# Patient Record
Sex: Female | Born: 1990 | Hispanic: Yes | Marital: Married | Smoking: Never smoker
Health system: Southern US, Community
[De-identification: ages and names within clinical notes are randomized; demographics above are authoritative.]

## PROBLEM LIST (undated history)

## (undated) DIAGNOSIS — K649 Unspecified hemorrhoids: Secondary | ICD-10-CM

## (undated) DIAGNOSIS — L309 Dermatitis, unspecified: Secondary | ICD-10-CM

## (undated) DIAGNOSIS — M722 Plantar fascial fibromatosis: Secondary | ICD-10-CM

## (undated) DIAGNOSIS — K859 Acute pancreatitis without necrosis or infection, unspecified: Secondary | ICD-10-CM

## (undated) HISTORY — DX: Plantar fascial fibromatosis: M72.2

## (undated) HISTORY — PX: CHOLECYSTECTOMY: SHX55

## (undated) HISTORY — DX: Dermatitis, unspecified: L30.9

## (undated) HISTORY — DX: Unspecified hemorrhoids: K64.9

---

## 2016-09-14 ENCOUNTER — Encounter (HOSPITAL_COMMUNITY): Payer: Self-pay | Admitting: Emergency Medicine

## 2016-09-14 ENCOUNTER — Inpatient Hospital Stay (HOSPITAL_COMMUNITY)
Admission: AD | Admit: 2016-09-14 | Discharge: 2016-09-14 | Disposition: A | Discharge status: Home / Self Care | Payer: Medicaid Other | Source: Ambulatory Visit | Attending: Family Medicine | Admitting: Family Medicine

## 2016-09-14 ENCOUNTER — Encounter (HOSPITAL_COMMUNITY): Payer: Self-pay

## 2016-09-14 ENCOUNTER — Ambulatory Visit (HOSPITAL_COMMUNITY)
Admission: EM | Admit: 2016-09-14 | Discharge: 2016-09-14 | Disposition: A | Discharge status: Home / Self Care | Payer: Medicaid Other

## 2016-09-14 DIAGNOSIS — R109 Unspecified abdominal pain: Secondary | ICD-10-CM | POA: Insufficient documentation

## 2016-09-14 DIAGNOSIS — R1030 Lower abdominal pain, unspecified: Secondary | ICD-10-CM

## 2016-09-14 DIAGNOSIS — N926 Irregular menstruation, unspecified: Secondary | ICD-10-CM | POA: Diagnosis not present

## 2016-09-14 HISTORY — DX: Acute pancreatitis without necrosis or infection, unspecified: K85.90

## 2016-09-14 LAB — CBC
HCT: 30.1 % — ABNORMAL LOW (ref 36.0–46.0)
Hemoglobin: 9.6 g/dL — ABNORMAL LOW (ref 12.0–15.0)
MCH: 28 pg (ref 26.0–34.0)
MCHC: 31.9 g/dL (ref 30.0–36.0)
MCV: 87.8 fL (ref 78.0–100.0)
PLATELETS: 358 10*3/uL (ref 150–400)
RBC: 3.43 MIL/uL — ABNORMAL LOW (ref 3.87–5.11)
RDW: 16.6 % — AB (ref 11.5–15.5)
WBC: 4 10*3/uL (ref 4.0–10.5)

## 2016-09-14 LAB — WET PREP, GENITAL
CLUE CELLS WET PREP: NONE SEEN
SPERM: NONE SEEN
Trich, Wet Prep: NONE SEEN
WBC WET PREP: NONE SEEN
YEAST WET PREP: NONE SEEN

## 2016-09-14 LAB — URINALYSIS, ROUTINE W REFLEX MICROSCOPIC
BACTERIA UA: NONE SEEN
Bilirubin Urine: NEGATIVE
Glucose, UA: NEGATIVE mg/dL
Ketones, ur: NEGATIVE mg/dL
Leukocytes, UA: NEGATIVE
Nitrite: NEGATIVE
PH: 6 (ref 5.0–8.0)
Protein, ur: NEGATIVE mg/dL
Specific Gravity, Urine: 1.005 (ref 1.005–1.030)
WBC UA: NONE SEEN WBC/hpf (ref 0–5)

## 2016-09-14 LAB — POCT PREGNANCY, URINE: PREG TEST UR: NEGATIVE

## 2016-09-14 NOTE — MAU Note (Signed)
Having pelvic, spotting.  proonged menstrual cycle.  Heavy cycles, unable to get up or function.  Feels like ovaries are heavy when she walks. LMP was Jan 7, bleeding has continued, spotting now.  Also has noted bleeding with intercourse, is painful

## 2016-09-14 NOTE — Discharge Instructions (Signed)
Abnormal Uterine Bleeding Abnormal uterine bleeding means bleeding from the vagina that is not your normal menstrual period. This can be:  Bleeding or spotting between periods.  Bleeding after sex (sexual intercourse).  Bleeding that is heavier or more than normal.  Periods that last longer than usual.  Bleeding after menopause. There are many problems that may cause this. Treatment will depend on the cause of the bleeding. Any kind of bleeding that is not normal should be reviewed by your doctor. Follow these instructions at home: Watch your condition for any changes. These actions may lessen any discomfort you are having:  Do not use tampons or douches as told by your doctor.  Change your pads often. You should get regular pelvic exams and Pap tests. Keep all appointments for tests as told by your doctor. Contact a doctor if:  You are bleeding for more than 1 week.  You feel dizzy at times. Get help right away if:  You pass out.  You have to change pads every 15 to 30 minutes.  You have belly pain.  You have a fever.  You become sweaty or weak.  You are passing large blood clots from the vagina.  You feel sick to your stomach (nauseous) and throw up (vomit). This information is not intended to replace advice given to you by your health care provider. Make sure you discuss any questions you have with your health care provider. Document Released: 06/06/2009 Document Revised: 01/15/2016 Document Reviewed: 03/08/2013 Elsevier Interactive Patient Education  2017 Elsevier Inc.  

## 2016-09-14 NOTE — ED Triage Notes (Signed)
Here for intermittent abd pain onset 6 months associated w/heavy menstrual bleeding, bleeding w/SI,   Reports she goes through 5 maternity pads per day when her menstrual cycle comes on and will last for x10 days.   Denies: fevers, n/v/d, urinary sx.   Pt does not have a PCP  A&O x4... NAD

## 2016-09-14 NOTE — ED Notes (Signed)
Pt left... Said she will go to [facility].... Did not want for us to waste here time if we were not going to do anything... Adv pt that she can see provider and wait for his clinical decision but she declined.

## 2016-09-14 NOTE — MAU Provider Note (Signed)
History    Patient Abigail Terry is a 26 year old G2P2 here with complaints of abdominal pain and irregular period. She is taking hormonal contraceptives. She has  irregular periods and has been having this problem for 6 months. Her suprapubic abdominal pain started yesterday and it is off and on. She states she has a lot of pain when she has her period. Her last period was in early January; her period has slowed down to spotting but she is still having pain.  [facility] today but left because she felt like "they weren't doing anything for her."  She sometimes has pain and bleeding with intercourse.  CSN: [id number]  Arrival date and time: 09/14/16 1112   None     Chief Complaint  Patient presents with  . Abdominal Pain   Abdominal Pain  This is a new problem. The current episode started yesterday. The onset quality is gradual. The problem occurs 2 to 4 times per day. The problem has been unchanged. The pain is located in the suprapubic region. The pain is at a severity of 7/10. The pain is moderate. The quality of the pain is cramping. The abdominal pain does not radiate. Pertinent negatives include no anorexia, arthralgias, belching, constipation, diarrhea, dysuria, fever, flatus, frequency, headaches, hematochezia, hematuria, melena, myalgias, nausea, vomiting or weight loss. Nothing aggravates the pain. The pain is relieved by nothing. She has tried nothing for the symptoms.    OB History    Gravida Para Term Preterm AB Living   2         2   SAB TAB Ectopic Multiple Live Births                  Past Medical History:  Diagnosis Date  . Medical history non-contributory   . Pancreatitis     Past Surgical History:  Procedure Laterality Date  . CHOLECYSTECTOMY      No family history on file.  Social History  Substance Use Topics  . Smoking status: Never Smoker  . Smokeless tobacco: Never Used  . Alcohol use No    Allergies: No Known  Allergies  No prescriptions prior to admission.    Review of Systems  Constitutional: Negative for fever and weight loss.  HENT: Negative.   Eyes: Negative.   Respiratory: Negative.   Gastrointestinal: Positive for abdominal pain. Negative for anorexia, constipation, diarrhea, flatus, hematochezia, melena, nausea and vomiting.  Endocrine: Negative.   Genitourinary: Positive for pelvic pain and vaginal bleeding. Negative for decreased urine volume, dysuria, frequency, hematuria, urgency, vaginal discharge and vaginal pain.  Musculoskeletal: Negative for arthralgias and myalgias.  Allergic/Immunologic: Negative.   Neurological: Negative for headaches.  Hematological: Negative.   Psychiatric/Behavioral: Negative.    Physical Exam   Blood pressure 106/67, pulse 72, temperature 98 F (36.7 C), temperature source Oral, resp. rate 16, weight 166 lb 12 oz (75.6 kg), last menstrual period 08/29/2016.  Physical Exam  Constitutional: She is oriented to person, place, and time. She appears well-developed.  HENT:  Head: Normocephalic.  Eyes: Pupils are equal, round, and reactive to light.  Neck: Normal range of motion.  Respiratory: Effort normal. No respiratory distress. She has no wheezes. She has no rales. She exhibits no tenderness.  GI: Bowel sounds are normal. She exhibits no distension and no mass. There is tenderness. There is no rebound and no guarding.  Genitourinary: Rectal exam shows guaiac negative stool. Vaginal discharge found.  Genitourinary Comments: NEFG.  Scant milky discharge in the vaginal vault; no lesions on vaginal walls. Cervix is pink with no lesions; some extroprion presents but non-friable. No CMT. Mild suprapubic tenderness with bimanual. No CVA.  No bleeding observed from the cervix.   Musculoskeletal: Normal range of motion.  Neurological: She is alert and oriented to person, place, and time.  Skin: Skin is warm and dry.  Psychiatric: She has a normal mood and  affect.  No vaginal bleeding present at this exam.   MAU Course  Procedures  MDM  -bimanual and spec exam -wet prep -GC CT pending Assessment and Plan   1. Lower abdominal pain    2. Discharge home with recommendations to take tylenol /ibuprofen for symptoms, heat, and rest.  3. Sent a message to the [facility] to schedule her for a gyn visit to discuss contraceptive methods, pain management and further work-up for dysmenorrhea and menorrhagia.    Charlesetta Garibaldi Kooistra CNM 09/14/2016, 1:18 PM

## 2016-09-15 LAB — GC/CHLAMYDIA PROBE AMP (~~LOC~~) NOT AT ARMC
CHLAMYDIA, DNA PROBE: NEGATIVE
NEISSERIA GONORRHEA: NEGATIVE

## 2017-06-02 ENCOUNTER — Inpatient Hospital Stay (HOSPITAL_COMMUNITY)
Admission: AD | Admit: 2017-06-02 | Discharge: 2017-06-02 | Disposition: A | Discharge status: Home / Self Care | Payer: Medicaid Other | Source: Ambulatory Visit | Attending: Family Medicine | Admitting: Family Medicine

## 2017-06-02 ENCOUNTER — Encounter (HOSPITAL_COMMUNITY): Payer: Self-pay | Admitting: *Deleted

## 2017-06-02 DIAGNOSIS — Z202 Contact with and (suspected) exposure to infections with a predominantly sexual mode of transmission: Secondary | ICD-10-CM | POA: Insufficient documentation

## 2017-06-02 DIAGNOSIS — Z9049 Acquired absence of other specified parts of digestive tract: Secondary | ICD-10-CM | POA: Diagnosis not present

## 2017-06-02 DIAGNOSIS — Z113 Encounter for screening for infections with a predominantly sexual mode of transmission: Secondary | ICD-10-CM

## 2017-06-02 DIAGNOSIS — R109 Unspecified abdominal pain: Secondary | ICD-10-CM

## 2017-06-02 DIAGNOSIS — Z3202 Encounter for pregnancy test, result negative: Secondary | ICD-10-CM | POA: Diagnosis not present

## 2017-06-02 LAB — URINALYSIS, ROUTINE W REFLEX MICROSCOPIC
Bilirubin Urine: NEGATIVE
Glucose, UA: NEGATIVE mg/dL
KETONES UR: NEGATIVE mg/dL
Leukocytes, UA: NEGATIVE
Nitrite: NEGATIVE
PROTEIN: NEGATIVE mg/dL
Specific Gravity, Urine: 1.02 (ref 1.005–1.030)
pH: 6 (ref 5.0–8.0)

## 2017-06-02 LAB — WET PREP, GENITAL
Clue Cells Wet Prep HPF POC: NONE SEEN
Sperm: NONE SEEN
Trich, Wet Prep: NONE SEEN
YEAST WET PREP: NONE SEEN

## 2017-06-02 LAB — POCT PREGNANCY, URINE: Preg Test, Ur: NEGATIVE

## 2017-06-02 MED ORDER — KETOROLAC TROMETHAMINE 60 MG/2ML IM SOLN
60.0000 mg | Freq: Once | INTRAMUSCULAR | Status: AC
  Administered 2017-06-02: 60 mg via INTRAMUSCULAR
  Filled 2017-06-02: qty 2

## 2017-06-02 NOTE — Discharge Instructions (Signed)
Dolor abdominal en adultos °Abdominal Pain, Adult °El dolor abdominal puede tener muchas causas. A menudo, no es grave y mejora sin tratamiento o con tratamiento en la casa. Sin embargo, a veces el dolor abdominal es intenso. El médico revisará sus antecedentes médicos y le hará un examen físico para tratar de determinar la causa del dolor abdominal. °Siga estas instrucciones en su casa: °· Tome los medicamentos de venta libre y los recetados solamente como se lo haya indicado el médico. No tome un laxante a menos que se lo haya indicado el médico. °· Beba suficiente líquido para mantener la orina clara o de color amarillo pálido. °· Controle su afección para ver si hay cambios. °· Concurra a todas las visitas de control como se lo haya indicado el médico. Esto es importante. °Comuníquese con un médico si: °· El dolor abdominal cambia o empeora. °· No tiene apetito o baja de peso sin proponérselo. °· Está estreñido o tiene diarrea durante más de 2 o 3 días. °· Tiene dolor cuando orina o defeca. °· El dolor abdominal lo despierta de noche. °· El dolor empeora con las comidas, después de comer o con determinados alimentos. °· Tiene vómitos y no puede retener nada. °· Tiene fiebre. °Solicite ayuda de inmediato si: °· El dolor no desaparece tan pronto como el médico le dijo que era esperable. °· No puede detener los vómitos. °· El dolor se siente solo en zonas del abdomen, como el lado derecho o la parte inferior izquierda del abdomen. °· Las heces son sanguinolentas o de color negro, o de aspecto alquitranado. °· Tiene dolor intenso, cólicos, o meteorismo en el abdomen. °· Tiene signos de deshidratación, por ejemplo: °? Orina oscura, muy escasa o falta de orina. °? Labios agrietados. °? Boca seca. °? Ojos hundidos. °? Somnolencia. °? Debilidad. °Esta información no tiene como fin reemplazar el consejo del médico. Asegúrese de hacerle al médico cualquier pregunta que tenga. °Document Released: 08/09/2005 Document  Revised: 07/29/2016 Document Reviewed: 01/21/2016 °Elsevier Interactive Patient Education © 2017 Elsevier Inc. ° °

## 2017-06-02 NOTE — MAU Provider Note (Signed)
History     CSN: [id number]  Arrival date and time: 06/02/17 1030  First Provider Initiated Contact with Patient 06/02/17 1315      Chief Complaint  Patient presents with  . Pelvic Pain  . Vaginal Discharge   HPI Abigail Terry is a 26 y.o. female who presents with abdominal pain & vaginal discharge. Reports lower abdominal cramping since yesterday. Rates pain 7/10. Took 1 dose of ibuprofen yesterday without relief. Denies n/v/d, constipation, dysuria, dyspareunia, fever/chills, or postcoital bleeding. Noticed clear watery vaginal discharge since this morning. No odor to discharge & no vaginal irritation. LMP was 9/4; light bleeding started on Tuesday; history of irregular menses & dysmenorrhea. Has not been on OCPs x 4 months. Sexually active with 1 partner x 5 years. Denies history of STIs. Has a PCP; last pap was 2 years ago & was normal.   Spanish interpreter at bedside  Past Medical History:  Diagnosis Date  . Pancreatitis     Past Surgical History:  Procedure Laterality Date  . CHOLECYSTECTOMY      History reviewed. No pertinent family history.  Social History  Substance Use Topics  . Smoking status: Never Smoker  . Smokeless tobacco: Never Used  . Alcohol use No    Allergies: No Known Allergies  Prescriptions Prior to Admission  Medication Sig Dispense Refill Last Dose  . norethindrone-ethinyl estradiol-iron (ESTROSTEP FE,TILIA FE,TRI-LEGEST FE) 1-20/1-30/1-35 MG-MCG tablet Take 1 tablet by mouth daily.   08/23/2016    Review of Systems  Constitutional: Negative.   Gastrointestinal: Positive for abdominal pain. Negative for constipation, diarrhea, nausea and vomiting.  Genitourinary: Positive for vaginal bleeding and vaginal discharge. Negative for dyspareunia and dysuria.  Musculoskeletal: Negative for back pain.   Physical Exam   Blood pressure 110/64, pulse 86, temperature 98.6 F (37 C), resp. rate 18, height 5' 3.5" (1.613 m), weight 180 lb (81.6  kg), last menstrual period 04/26/2017.  Physical Exam  Nursing note and vitals reviewed. Constitutional: She is oriented to person, place, and time. She appears well-developed and well-nourished. No distress.  HENT:  Head: Normocephalic and atraumatic.  Eyes: Conjunctivae are normal. Right eye exhibits no discharge. Left eye exhibits no discharge. No scleral icterus.  Neck: Normal range of motion.  Cardiovascular: Normal rate, regular rhythm and normal heart sounds.   No murmur heard. Respiratory: Effort normal and breath sounds normal. No respiratory distress. She has no wheezes.  GI: Soft. Bowel sounds are normal. She exhibits no distension. There is tenderness in the suprapubic area and left lower quadrant. There is no rigidity, no rebound, no guarding and no CVA tenderness.  Genitourinary: Uterus normal. Cervix exhibits discharge (small amount of thin clear discharge) and friability. Cervix exhibits no motion tenderness. Right adnexum displays no mass and no tenderness. Left adnexum displays no mass and no tenderness. No bleeding in the vagina. Vaginal discharge found.  Neurological: She is alert and oriented to person, place, and time.  Skin: Skin is warm and dry. She is not diaphoretic.  Psychiatric: She has a normal mood and affect. Her behavior is normal. Judgment and thought content normal.    MAU Course  Procedures Results for orders placed or performed during the hospital encounter of 06/02/17 (from the past 24 hour(s))  Urinalysis, Routine w reflex microscopic     Status: Abnormal   Collection Time: 06/02/17 10:40 AM  Result Value Ref Range   Color, Urine YELLOW YELLOW   APPearance CLEAR CLEAR   Specific Gravity, Urine 1.020 1.005 -  1.030   pH 6.0 5.0 - 8.0   Glucose, UA NEGATIVE NEGATIVE mg/dL   Hgb urine dipstick SMALL (A) NEGATIVE   Bilirubin Urine NEGATIVE NEGATIVE   Ketones, ur NEGATIVE NEGATIVE mg/dL   Protein, ur NEGATIVE NEGATIVE mg/dL   Nitrite NEGATIVE NEGATIVE    Leukocytes, UA NEGATIVE NEGATIVE   RBC / HPF 0-5 0 - 5 RBC/hpf   WBC, UA 0-5 0 - 5 WBC/hpf   Bacteria, UA RARE (A) NONE SEEN   Squamous Epithelial / LPF 0-5 (A) NONE SEEN   Mucus PRESENT   Pregnancy, urine POC     Status: None   Collection Time: 06/02/17 11:41 AM  Result Value Ref Range   Preg Test, Ur NEGATIVE NEGATIVE  Wet prep, genital     Status: Abnormal   Collection Time: 06/02/17  1:30 PM  Result Value Ref Range   Yeast Wet Prep HPF POC NONE SEEN NONE SEEN   Trich, Wet Prep NONE SEEN NONE SEEN   Clue Cells Wet Prep HPF POC NONE SEEN NONE SEEN   WBC, Wet Prep HPF POC MODERATE (A) NONE SEEN   Sperm NONE SEEN     MDM UPT negative GC/CT & wet prep collected Declines labs. VSS, NAD.  Toradol IM given -- patient reports improvement in symptoms & requesting to be discharged home  Assessment and Plan  A: 1. Abdominal pain in female   2. Pregnancy examination or test, negative result   3. Screen for STD (sexually transmitted disease)    P: Discharge home Take ibuprofen on schedule for next 3 days.  F/u with PCP if pain continues F/u with ED if symptoms worsen or fever develops  Abigail Terry 06/02/2017, 1:15 PM

## 2017-06-02 NOTE — MAU Note (Signed)
Pt c/o pelvic pain and pain in her left flank x 15 days.Started having vag bleeding yesterday and clear fluid leaking today. Took home pregnancy test on Monday and it was negative.

## 2017-06-03 LAB — GC/CHLAMYDIA PROBE AMP (~~LOC~~) NOT AT ARMC
CHLAMYDIA, DNA PROBE: NEGATIVE
Neisseria Gonorrhea: NEGATIVE

## 2021-06-10 ENCOUNTER — Emergency Department (HOSPITAL_COMMUNITY)
Admission: EM | Admit: 2021-06-10 | Discharge: 2021-06-10 | Disposition: A | Discharge status: Home / Self Care | Payer: Medicaid Other | Attending: Emergency Medicine | Admitting: Emergency Medicine

## 2021-06-10 ENCOUNTER — Emergency Department (HOSPITAL_COMMUNITY): Payer: Medicaid Other

## 2021-06-10 ENCOUNTER — Other Ambulatory Visit: Payer: Self-pay

## 2021-06-10 ENCOUNTER — Encounter (HOSPITAL_COMMUNITY): Payer: Self-pay | Admitting: Oncology

## 2021-06-10 DIAGNOSIS — N920 Excessive and frequent menstruation with regular cycle: Secondary | ICD-10-CM

## 2021-06-10 DIAGNOSIS — O200 Threatened abortion: Secondary | ICD-10-CM | POA: Diagnosis not present

## 2021-06-10 DIAGNOSIS — Z3A01 Less than 8 weeks gestation of pregnancy: Secondary | ICD-10-CM | POA: Diagnosis not present

## 2021-06-10 LAB — URINALYSIS, ROUTINE W REFLEX MICROSCOPIC
Bilirubin Urine: NEGATIVE
Glucose, UA: NEGATIVE mg/dL
Ketones, ur: NEGATIVE mg/dL
Leukocytes,Ua: NEGATIVE
Nitrite: NEGATIVE
Protein, ur: NEGATIVE mg/dL
Specific Gravity, Urine: 1.013 (ref 1.005–1.030)
pH: 5 (ref 5.0–8.0)

## 2021-06-10 LAB — CBC WITH DIFFERENTIAL/PLATELET
Abs Immature Granulocytes: 0 10*3/uL (ref 0.00–0.07)
Basophils Absolute: 0 10*3/uL (ref 0.0–0.1)
Basophils Relative: 1 %
Eosinophils Absolute: 0.2 10*3/uL (ref 0.0–0.5)
Eosinophils Relative: 4 %
HCT: 40.6 % (ref 36.0–46.0)
Hemoglobin: 12.9 g/dL (ref 12.0–15.0)
Immature Granulocytes: 0 %
Lymphocytes Relative: 31 %
Lymphs Abs: 1.7 10*3/uL (ref 0.7–4.0)
MCH: 30.2 pg (ref 26.0–34.0)
MCHC: 31.8 g/dL (ref 30.0–36.0)
MCV: 95.1 fL (ref 80.0–100.0)
Monocytes Absolute: 0.5 10*3/uL (ref 0.1–1.0)
Monocytes Relative: 9 %
Neutro Abs: 3 10*3/uL (ref 1.7–7.7)
Neutrophils Relative %: 55 %
Platelets: 295 10*3/uL (ref 150–400)
RBC: 4.27 MIL/uL (ref 3.87–5.11)
RDW: 13 % (ref 11.5–15.5)
WBC: 5.4 10*3/uL (ref 4.0–10.5)
nRBC: 0 % (ref 0.0–0.2)

## 2021-06-10 LAB — BASIC METABOLIC PANEL
Anion gap: 6 (ref 5–15)
BUN: 13 mg/dL (ref 6–20)
CO2: 25 mmol/L (ref 22–32)
Calcium: 9.6 mg/dL (ref 8.9–10.3)
Chloride: 104 mmol/L (ref 98–111)
Creatinine, Ser: 0.64 mg/dL (ref 0.44–1.00)
GFR, Estimated: >60 mL/min (ref >60)
Glucose, Bld: 92 mg/dL (ref 70–99)
Potassium: 4 mmol/L (ref 3.5–5.1)
Sodium: 135 mmol/L (ref 135–145)

## 2021-06-10 LAB — I-STAT BETA HCG BLOOD, ED (MC, WL, AP ONLY): I-stat hCG, quantitative: >2000 m[IU]/mL — ABNORMAL HIGH (ref <5)

## 2021-06-10 LAB — HCG, QUANTITATIVE, PREGNANCY: hCG, Beta Chain, Quant, S: 17267 m[IU]/mL — ABNORMAL HIGH (ref <5)

## 2021-06-10 NOTE — Discharge Instructions (Addendum)
Follow up with your [facility] doctor tomorrow as planned.  They will be able to see the results from today.  Unfortunately, the [place] does not show a fetus and your pregnancy hormone level is decreasing.  This is concerning for a failed pregnancy.

## 2021-06-10 NOTE — ED Notes (Signed)
Pt ambulatory to bathroom w/ no assist.  

## 2021-06-10 NOTE — ED Triage Notes (Signed)
Pt presents d/t left ovary pain and vaginal bleeding in pregnancy that began last night.  Pt reports being [redacted] weeks pregnant. Pt is seeing spots of blood when wiping.

## 2021-06-10 NOTE — ED Provider Notes (Signed)
[facility]EMERGENCY DEPT Provider Note   CSN: [id number] Arrival date & time: 06/10/21  5456     History Chief Complaint  Patient presents with   Abdominal Pain    Abigail Terry is a 30 y.o. female.   Abdominal Pain   Pt is G3P2 at approx 2 months EGA.  Pt started having pain in her lower abdomen and bleeeding.  The pain comes and goes.  Pt has been evaluated before and was told she had an IUP but no fetal heart tones.  Patient has followed up with her OB doctor.  She feels to follow-up tomorrow again to be rechecked.  Patient denies any problems with vomiting or diarrhea.  She has noticed small amount of vaginal bleeding.  OB MD is in [facility].  Past Medical History:  Diagnosis Date   Pancreatitis     There are no problems to display for this patient.   Past Surgical History:  Procedure Laterality Date   CHOLECYSTECTOMY       OB History     Gravida  4   Para  2   Term  2   Preterm      AB  1   Living  2      SAB  1   IAB      Ectopic      Multiple      Live Births              No family history on file.  Social History   Tobacco Use   Smoking status: Never   Smokeless tobacco: Never  Substance Use Topics   Alcohol use: No   Drug use: No    Home Medications Prior to Admission medications   Medication Sig Start Date End Date Taking? Authorizing Provider  ibuprofen (ADVIL,MOTRIN) 200 MG tablet Take 400 mg by mouth every 6 (six) hours as needed.    [provider]    Allergies    Patient has no known allergies.  Review of Systems   Review of Systems  Gastrointestinal:  Positive for abdominal pain.  All other systems reviewed and are negative.  Physical Exam Updated Vital Signs BP 124/75   Pulse 74   Temp 98.9 F (37.2 C) (Oral)   Resp 16   LMP 03/19/2021 (Exact Date)   SpO2 99%   Physical Exam Vitals and nursing note reviewed.  Constitutional:      General: She is not in  acute distress.    Appearance: She is well-developed.  HENT:     Head: Normocephalic and atraumatic.     Right Ear: External ear normal.     Left Ear: External ear normal.  Eyes:     General: No scleral icterus.       Right eye: No discharge.        Left eye: No discharge.     Conjunctiva/sclera: Conjunctivae normal.  Neck:     Trachea: No tracheal deviation.  Cardiovascular:     Rate and Rhythm: Normal rate and regular rhythm.  Pulmonary:     Effort: Pulmonary effort is normal. No respiratory distress.     Breath sounds: Normal breath sounds. No stridor. No wheezing or rales.  Abdominal:     General: Bowel sounds are normal. There is no distension.     Palpations: Abdomen is soft.     Tenderness: There is no abdominal tenderness. There is no guarding or rebound.  Genitourinary:    Vagina:  No tenderness.     Uterus: Not tender.      Adnexa:        Right: No mass.         Left: No mass.       Comments: Small amount of blood noted on cervical os, Musculoskeletal:        General: No tenderness or deformity.     Cervical back: Neck supple.  Skin:    General: Skin is warm and dry.     Findings: No rash.  Neurological:     General: No focal deficit present.     Mental Status: She is alert.     Cranial Nerves: No cranial nerve deficit (no facial droop, extraocular movements intact, no slurred speech).     Sensory: No sensory deficit.     Motor: No abnormal muscle tone or seizure activity.     Coordination: Coordination normal.  Psychiatric:        Mood and Affect: Mood normal.    ED Results / Procedures / Treatments   Labs (all labs ordered are listed, but only abnormal results are displayed) Labs Reviewed  HCG, QUANTITATIVE, PREGNANCY - Abnormal; Notable for the following components:      Result Value   hCG, Beta Chain, Quant, S 17,267 (*)    All other components within normal limits  URINALYSIS, ROUTINE W REFLEX MICROSCOPIC - Abnormal; Notable for the following  components:   Color, Urine STRAW (*)    Hgb urine dipstick SMALL (*)    Bacteria, UA RARE (*)    All other components within normal limits  I-STAT BETA HCG BLOOD, ED ([facility], [facility], [facility] ONLY) - Abnormal; Notable for the following components:   I-stat hCG, quantitative >2,000.0 (*)    All other components within normal limits  CBC WITH DIFFERENTIAL/PLATELET  BASIC METABOLIC PANEL  RPR  GC/CHLAMYDIA PROBE AMP (Centre) NOT AT [facility]    EKG None  Radiology No results found.  Procedures Procedures   Medications Ordered in ED Medications - No data to display  ED Course  I have reviewed the triage vital signs and the nursing notes.  Pertinent labs & imaging results that were available during my care of the patient were reviewed by me and considered in my medical decision making (see chart for details).  Clinical Course as of 06/10/21 1540  Wed Jun 10, 2021  1447 Blood type o positive.  Quant elevated.  CBC and metabolic panel normal.  US shows gestational sac but no IUP.   Concerning for miscarriage [JK]    Clinical Course User Index [JK] Linwood Dibbles, MD   MDM Rules/Calculators/A&P                           Patient's presentation is concerning with inevitable miscarriage.  She has decreasing quantitative hCG levels.  She has had 2 ultrasounds now that demonstrate a gestational sac but no fetal pole.  Patient is afebrile.  No signs of serious bleeding.  She has outpatient follow-up with OB/GYN tomorrow which is appropriate to determine further treatment.  Findings and plan were discussed using Spanish language translator Final Clinical Impression(s) / ED Diagnoses Final diagnoses:  Threatened miscarriage    Rx / DC Orders ED Discharge Orders     None        Linwood Dibbles, MD 06/10/21 1540

## 2021-06-10 NOTE — ED Provider Notes (Addendum)
Emergency Medicine Provider Triage Evaluation Note  Abigail Terry , a 30 y.o. female  was evaluated in triage.  Pt complains of vaginal bleeding and pain.  She is approximately [redacted] weeks pregnant according to her last OB note.  She was seen 2 weeks ago at [facility] for similar complaints.  Initially had ultrasound in September at outside hospital which showed gestational sac however no fetal heart tones.  Pain began yesterday.  O positive blood from outside records  Review of Systems  Positive: Abdominal pain, vaginal bleeding Negative:   Physical Exam  BP 114/67 (BP Location: Right Arm)   Pulse 90   Temp 98.5 F (36.9 C) (Oral)   Resp 18   LMP 03/19/2021 (Exact Date)   SpO2 99%  Gen:   Awake, no distress   Resp:  Normal effort  MSK:   Moves extremities without difficulty  Other:    Medical Decision Making  Medically screening exam initiated at 12:57 PM.  Appropriate orders placed.  Abigail Terry was informed that the remainder of the evaluation will be completed by another provider, this initial triage assessment does not replace that evaluation, and the importance of remaining in the ED until their evaluation is complete.  Work-up started  Hemodynamically stable      Ermine Stebbins A, PA-C 06/10/21 1259    Mancel Bale, MD 06/10/21 [phone]

## 2021-06-11 LAB — GC/CHLAMYDIA PROBE AMP (~~LOC~~) NOT AT ARMC
Chlamydia: NEGATIVE
Comment: NEGATIVE
Comment: NORMAL
Neisseria Gonorrhea: NEGATIVE

## 2022-05-13 ENCOUNTER — Other Ambulatory Visit: Payer: Self-pay | Admitting: Physician Assistant

## 2022-05-13 DIAGNOSIS — N644 Mastodynia: Secondary | ICD-10-CM

## 2022-11-14 IMAGING — US US OB COMP LESS 14 WK
2 series · 15 of 28 positions shown · non-contrast
Comparison: None.

CLINICAL DATA: Vaginal bleeding.

EXAM:
OBSTETRIC <14 WK US AND TRANSVAGINAL OB US
TECHNIQUE: Both transabdominal and transvaginal ultrasound examinations were
performed for complete evaluation of the gestation as well as the
maternal uterus, adnexal regions, and pelvic cul-de-sac.
Transvaginal technique was performed to assess early pregnancy.

[Series 1: [facility] · 52 acquisitions, 5 frames shown (1 of 2)]
[im 1/52]
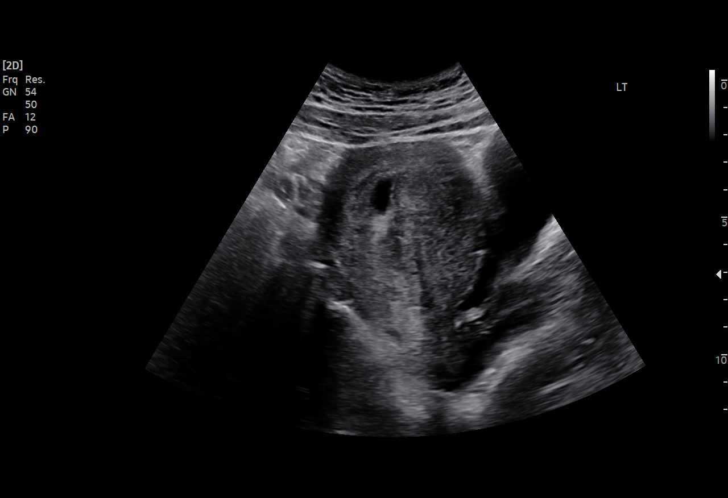
[im 12/52]
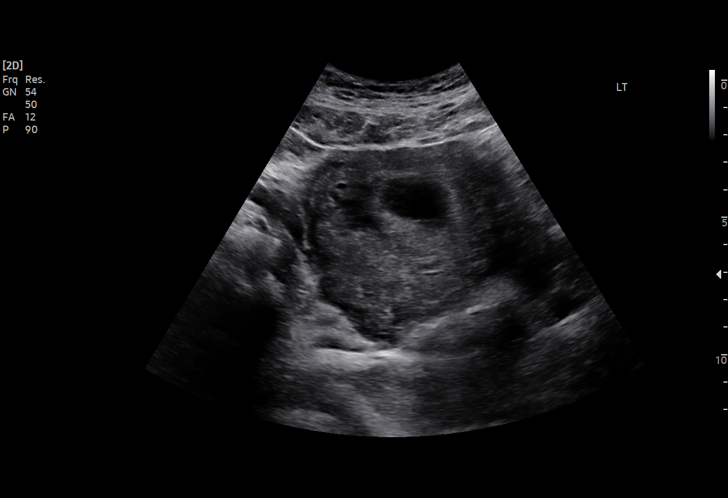
[im 23/52]
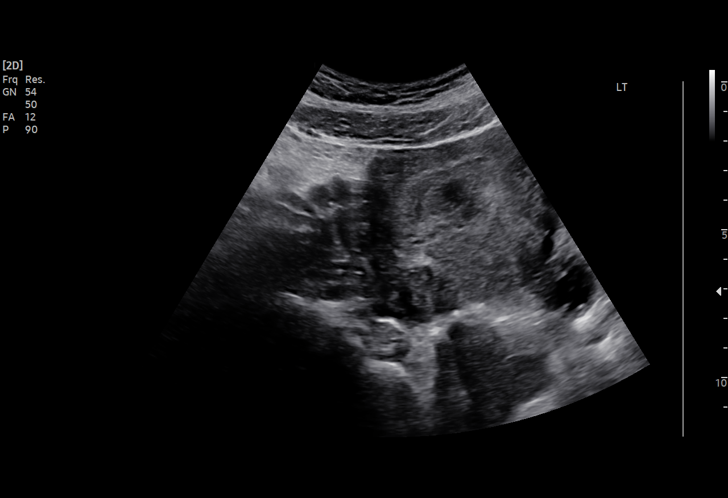
[im 35/52]
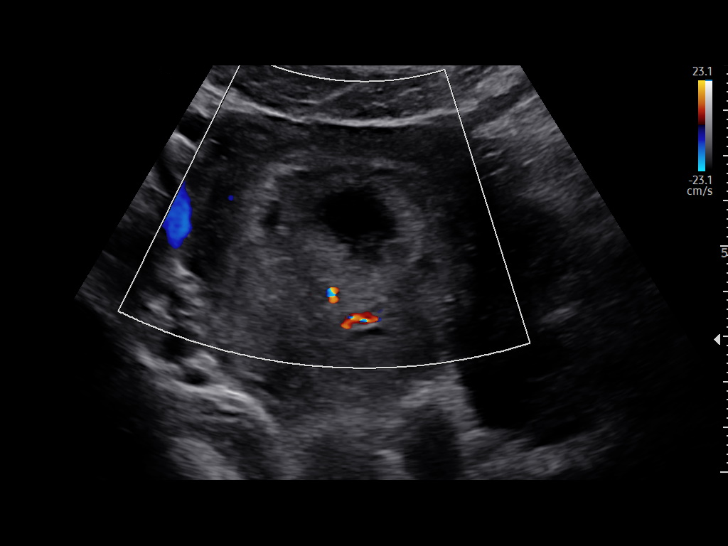
[im 46/52]
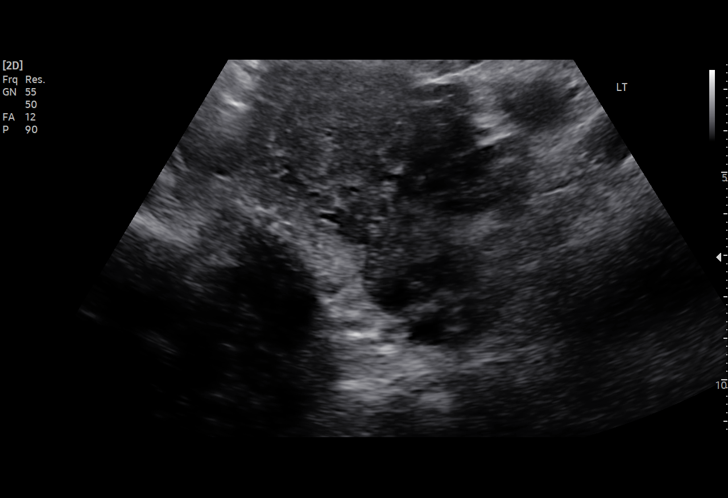

[Series 3: [facility] · 10 of 90 slices shown (2 of 2)]
[im 1/90]
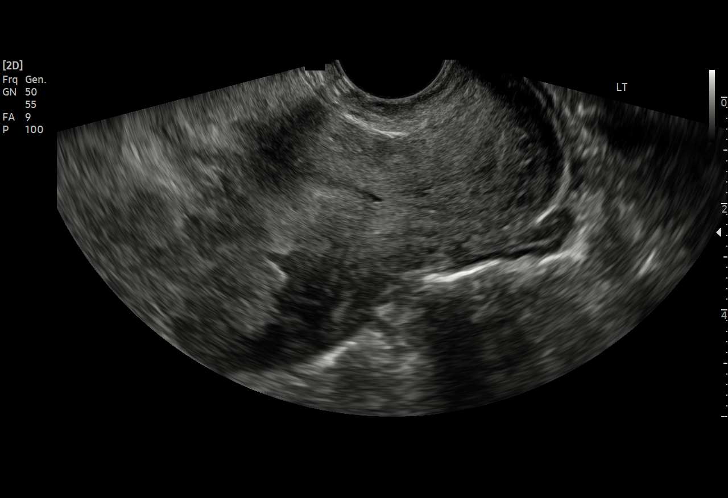
[im 11/90]
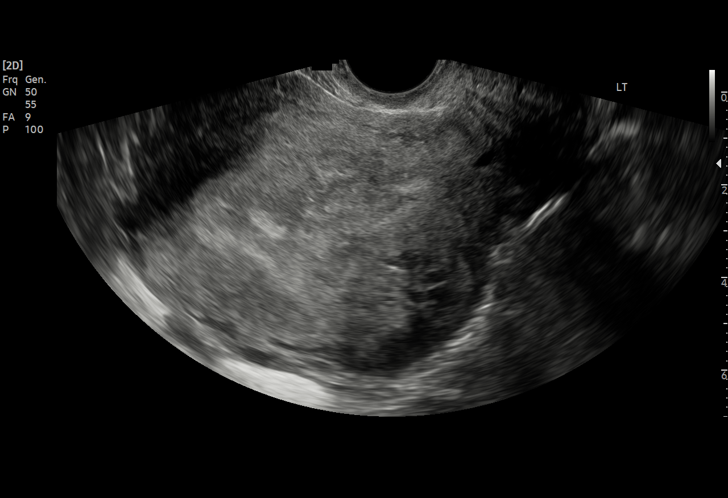
[im 21/90]
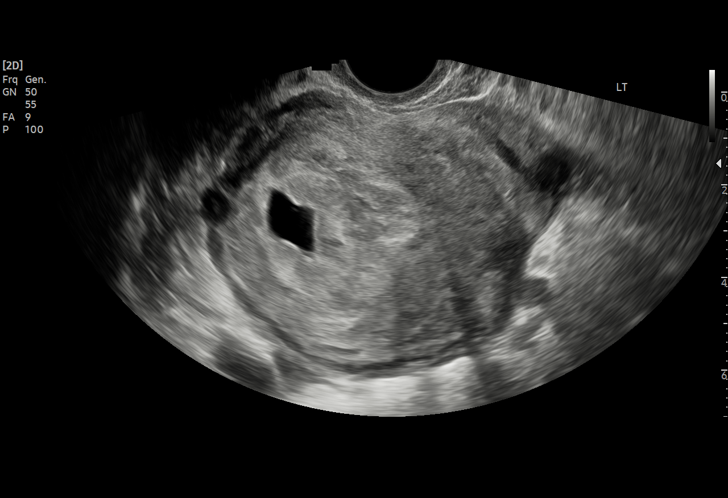
[im 27/90]
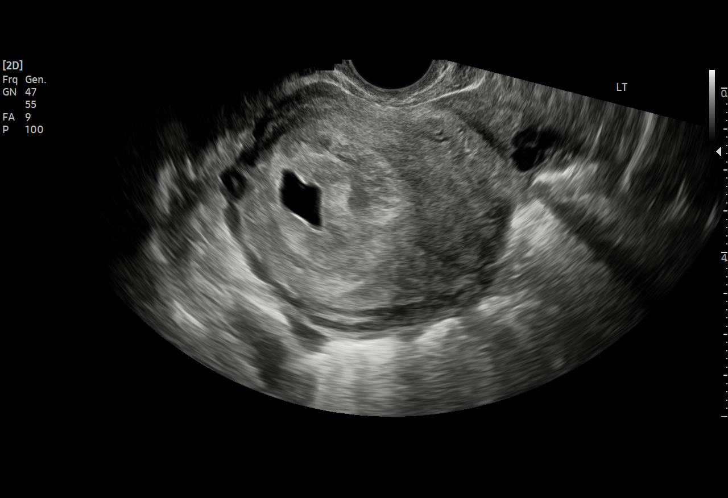
[im 37/90]
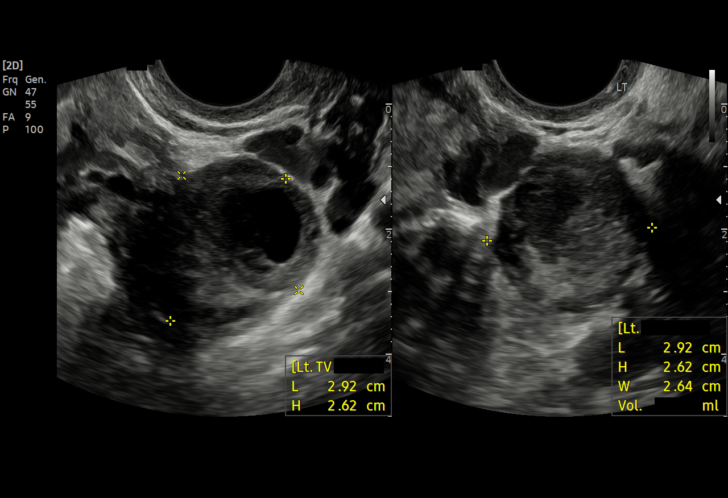
[im 48/90]
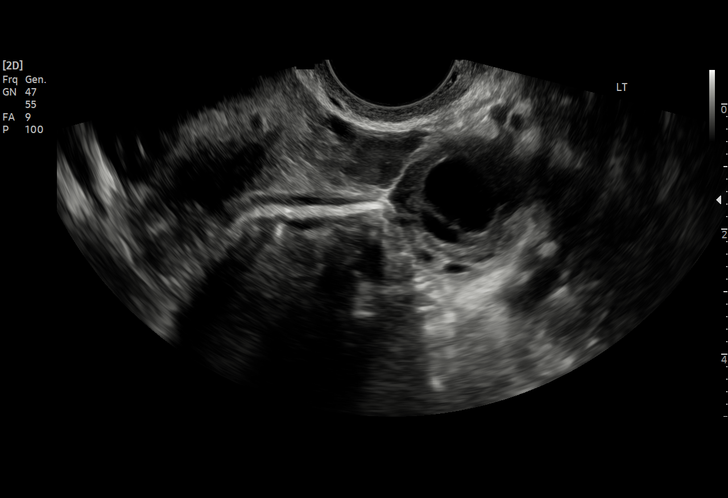
[im 58/90]
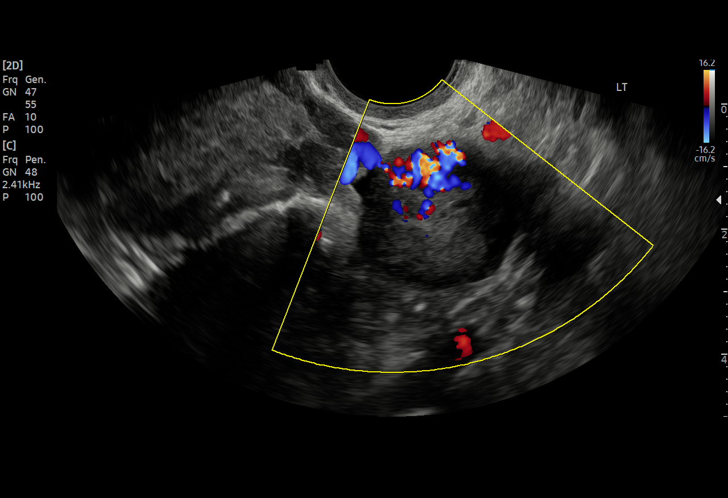
[im 69/90]
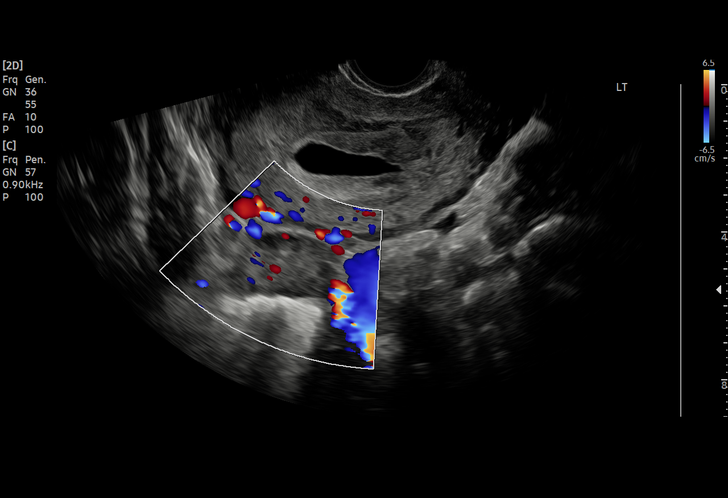
[im 79/90]
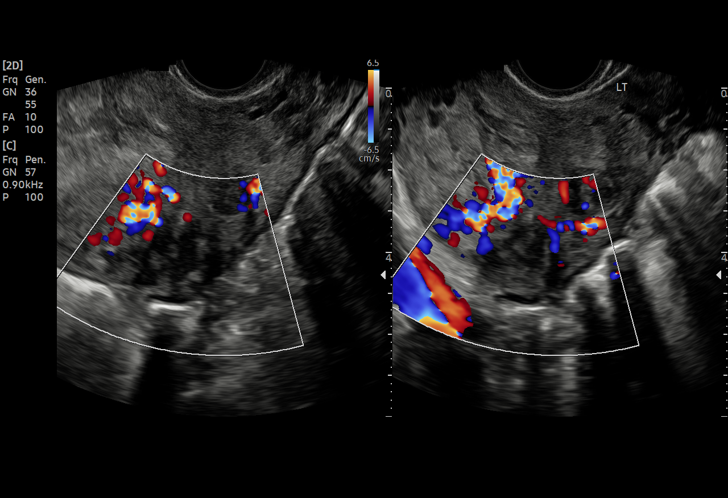
[im 90/90]
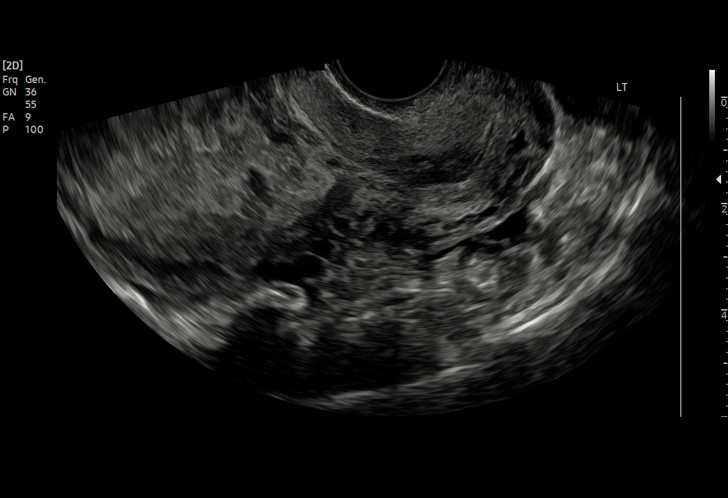

[15 of 28 positions shown; findings below may reference images not displayed]

FINDINGS: Intrauterine gestational sac: Irregular shaped gestational sac.

Yolk sac:  None

Embryo:  None

Cardiac Activity: N/A

Heart Rate: N/A bpm

MSD: 18.8 mm   6 w   6 d

Subchorionic hemorrhage:  Trace fluid in the cul-de-sac.

Maternal uterus/adnexae: Both ovaries are normal. No adnexal mass. 2
small uterine fibroids are noted.
IMPRESSION: Irregular gestational sac estimated at 6 weeks and 6 days gestation
but no embryonic pole or yolk sac is identified. Findings are
suspicious but not yet definitive for failed pregnancy. Recommend
follow-up US in 10-14 days for definitive diagnosis. This
recommendation follows SRU consensus guidelines: Diagnostic Criteria
for Nonviable Pregnancy Early in the First Trimester. N Engl J Med

## 2023-07-07 ENCOUNTER — Encounter: Payer: Self-pay | Admitting: Family Medicine

## 2023-07-17 ENCOUNTER — Emergency Department (HOSPITAL_COMMUNITY)
Admission: EM | Admit: 2023-07-17 | Discharge: 2023-07-17 | Disposition: A | Discharge status: Home / Self Care | Payer: Medicaid Other | Attending: Emergency Medicine | Admitting: Emergency Medicine

## 2023-07-17 ENCOUNTER — Emergency Department (HOSPITAL_COMMUNITY): Payer: Medicaid Other

## 2023-07-17 ENCOUNTER — Other Ambulatory Visit: Payer: Self-pay

## 2023-07-17 ENCOUNTER — Encounter (HOSPITAL_COMMUNITY): Payer: Self-pay

## 2023-07-17 DIAGNOSIS — R0789 Other chest pain: Secondary | ICD-10-CM | POA: Diagnosis not present

## 2023-07-17 DIAGNOSIS — R002 Palpitations: Secondary | ICD-10-CM | POA: Diagnosis present

## 2023-07-17 LAB — BASIC METABOLIC PANEL
Anion gap: 7 (ref 5–15)
BUN: 14 mg/dL (ref 6–20)
CO2: 27 mmol/L (ref 22–32)
Calcium: 9 mg/dL (ref 8.9–10.3)
Chloride: 104 mmol/L (ref 98–111)
Creatinine, Ser: 0.72 mg/dL (ref 0.44–1.00)
GFR, Estimated: >60 mL/min (ref >60)
Glucose, Bld: 129 mg/dL — ABNORMAL HIGH (ref 70–99)
Potassium: 3.6 mmol/L (ref 3.5–5.1)
Sodium: 138 mmol/L (ref 135–145)

## 2023-07-17 LAB — CBC
HCT: 41.2 % (ref 36.0–46.0)
Hemoglobin: 13.4 g/dL (ref 12.0–15.0)
MCH: 30 pg (ref 26.0–34.0)
MCHC: 32.5 g/dL (ref 30.0–36.0)
MCV: 92.2 fL (ref 80.0–100.0)
Platelets: 264 10*3/uL (ref 150–400)
RBC: 4.47 MIL/uL (ref 3.87–5.11)
RDW: 13.3 % (ref 11.5–15.5)
WBC: 4.6 10*3/uL (ref 4.0–10.5)
nRBC: 0 % (ref 0.0–0.2)

## 2023-07-17 LAB — HCG, SERUM, QUALITATIVE: Preg, Serum: NEGATIVE

## 2023-07-17 LAB — TROPONIN I (HIGH SENSITIVITY)
Troponin I (High Sensitivity): 2 ng/L (ref <18)
Troponin I (High Sensitivity): 2 ng/L (ref <18)

## 2023-07-17 LAB — TSH: TSH: 1.442 u[IU]/mL (ref 0.350–4.500)

## 2023-07-17 MED ORDER — KETOROLAC TROMETHAMINE 30 MG/ML IJ SOLN
30.0000 mg | Freq: Once | INTRAMUSCULAR | Status: DC
  Filled 2023-07-17: qty 1

## 2023-07-17 NOTE — ED Triage Notes (Signed)
Pt arrived POV d/t palpitations, pulsating pain in chest, Headache, cramping in neck and left arm - onset 3 days ago.   At this time she only feels cramping in her left arm.

## 2023-07-17 NOTE — Discharge Instructions (Signed)
You have been seen and discharged from the emergency department.  Your blood work and her evaluation was normal.  Follow-up with your primary provider for further evaluation and further care.  They may recommend remote monitor to evaluate for palpitations.  Take home medications as prescribed. If you have any worsening symptoms or further concerns for your health please return to an emergency department for further evaluation.

## 2023-07-17 NOTE — ED Notes (Signed)
Pt refused medication. Md made aware

## 2023-07-17 NOTE — ED Provider Notes (Signed)
[place] EMERGENCY DEPARTMENT AT [facility] Provider Note   CSN: [id number] Arrival date & time: 07/17/23  1903     History  Chief Complaint  Patient presents with   Palpitations    Abigail Terry is a 32 y.o. female.  HPI   32 year old female presents emergency department with concern for palpitations, pulsating tightness in her chest, resolved headache and a cramping sensation in her left arm.  Patient states this has been going on for the past 3 days.  Everything is resolved outside of some mild cramping in the left arm.  She denies any shortness of breath, lightheadedness, syncope.  She is not on any oral contraceptives, no history of DVT.  Currently she has no chest pain or shortness of breath.  She is otherwise been in her usual state of health.  Home Medications Prior to Admission medications   Medication Sig Start Date End Date Taking? Authorizing Provider  ibuprofen (ADVIL,MOTRIN) 200 MG tablet Take 400 mg by mouth every 6 (six) hours as needed.    [provider]      Allergies    Patient has no known allergies.    Review of Systems   Review of Systems  Constitutional:  Negative for fatigue and fever.  Eyes:  Negative for visual disturbance.  Respiratory:  Negative for cough, chest tightness and shortness of breath.   Cardiovascular:  Positive for chest pain and palpitations. Negative for leg swelling.  Gastrointestinal:  Negative for abdominal pain, diarrhea and vomiting.  Musculoskeletal:  Negative for neck pain and neck stiffness.       + LUE cramping  Skin:  Negative for rash.  Neurological:  Positive for headaches. Negative for dizziness, weakness, light-headedness and numbness.    Physical Exam Updated Vital Signs BP 131/79   Pulse 88   Temp 97.9 F (36.6 C) (Oral)   Resp 18   Ht 5\' 4"  (1.626 m)   Wt 98 kg   LMP 07/17/2023   SpO2 100%   BMI 37.08 kg/m  Physical Exam Vitals and nursing note reviewed.   Constitutional:      General: She is not in acute distress.    Appearance: Normal appearance.  HENT:     Head: Normocephalic.     Mouth/Throat:     Mouth: Mucous membranes are moist.  Eyes:     Extraocular Movements: Extraocular movements intact.     Pupils: Pupils are equal, round, and reactive to light.  Cardiovascular:     Rate and Rhythm: Normal rate.  Pulmonary:     Effort: Pulmonary effort is normal. No respiratory distress.  Abdominal:     Palpations: Abdomen is soft.     Tenderness: There is no abdominal tenderness.  Musculoskeletal:     Cervical back: No rigidity or tenderness.     Comments: No LUE TTP, no weakness, normal pulses  Skin:    General: Skin is warm.  Neurological:     Mental Status: She is alert and oriented to person, place, and time. Mental status is at baseline.  Psychiatric:        Mood and Affect: Mood normal.     ED Results / Procedures / Treatments   Labs (all labs ordered are listed, but only abnormal results are displayed) Labs Reviewed  BASIC METABOLIC PANEL - Abnormal; Notable for the following components:      Result Value   Glucose, Bld 129 (*)    All other components within normal limits  CBC  HCG, SERUM, QUALITATIVE  MAGNESIUM  TSH  TROPONIN I (HIGH SENSITIVITY)  TROPONIN I (HIGH SENSITIVITY)    EKG EKG Interpretation Date/Time:  Sunday July 17 2023 19:19:08 EST Ventricular Rate:  93 PR Interval:  176 QRS Duration:  90 QT Interval:  364 QTC Calculation: 453 R Axis:   47  Text Interpretation: Sinus rhythm Low voltage, precordial leads Borderline T abnormalities, anterior leads Confirmed by Coralee Pesa [id number]) on 07/17/2023 7:57:49 PM  Radiology DG Chest 2 View  Result Date: 07/17/2023 CLINICAL DATA:  Chest pain EXAM: CHEST - 2 VIEW COMPARISON:  None Available. FINDINGS: The heart size and mediastinal contours are within normal limits. Both lungs are clear. The visualized skeletal structures are unremarkable.  IMPRESSION: No active cardiopulmonary disease. Electronically Signed   By: Darliss Cheney M.D.   On: 07/17/2023 20:29    Procedures Procedures    Medications Ordered in ED Medications  ketorolac (TORADOL) 30 MG/ML injection 30 mg (has no administration in time range)    ED Course/ Medical Decision Making/ A&P                                 Medical Decision Making Amount and/or Complexity of Data Reviewed Labs: ordered. Radiology: ordered.  Risk Prescription drug management.   32 year old female presents emergency department palpitations, pulsating tightness in the chest, resolved headache and cramping sensation in her left arm.  Vitals are normal and stable on arrival.  EKG shows sinus rhythm with normal intervals.  Blood work is normal, troponin is negative, electrolytes are normal.  On the monitor she does every now and then have PVCs which may be the source of her symptoms.  Offer the patient medication for cramping sensation she declined.  Doubt that the symptoms are related to anything emergent/acute like ACS, PE.  She is PERC negative.  On reevaluation she feels well.  Will plan for outpatient follow-up and possible Holter monitoring.  Results discussed with interpreter.  Patient at this time appears safe and stable for discharge and close outpatient follow up. Discharge plan and strict return to ED precautions discussed, patient verbalizes understanding and agreement.        Final Clinical Impression(s) / ED Diagnoses Final diagnoses:  None    Rx / DC Orders ED Discharge Orders     None         Rozelle Logan, DO 07/17/23 2326

## 2023-07-18 LAB — MAGNESIUM: Magnesium: 2.2 mg/dL (ref 1.7–2.4)

## 2023-08-03 ENCOUNTER — Encounter: Payer: Self-pay | Admitting: Gastroenterology

## 2023-10-18 ENCOUNTER — Ambulatory Visit (INDEPENDENT_AMBULATORY_CARE_PROVIDER_SITE_OTHER): Payer: Medicaid Other | Admitting: Gastroenterology

## 2023-10-18 ENCOUNTER — Encounter: Payer: Self-pay | Admitting: Gastroenterology

## 2023-10-18 ENCOUNTER — Ambulatory Visit (INDEPENDENT_AMBULATORY_CARE_PROVIDER_SITE_OTHER)
Admission: RE | Admit: 2023-10-18 | Discharge: 2023-10-18 | Disposition: A | Discharge status: Home / Self Care | Payer: Medicaid Other | Source: Ambulatory Visit | Attending: Gastroenterology

## 2023-10-18 ENCOUNTER — Other Ambulatory Visit: Payer: Medicaid Other

## 2023-10-18 VITALS — BP 110/78 | HR 74 | Ht 64.0 in | Wt 212.4 lb

## 2023-10-18 DIAGNOSIS — R1013 Epigastric pain: Secondary | ICD-10-CM | POA: Diagnosis not present

## 2023-10-18 DIAGNOSIS — R1032 Left lower quadrant pain: Secondary | ICD-10-CM

## 2023-10-18 DIAGNOSIS — K219 Gastro-esophageal reflux disease without esophagitis: Secondary | ICD-10-CM

## 2023-10-18 DIAGNOSIS — K625 Hemorrhage of anus and rectum: Secondary | ICD-10-CM

## 2023-10-18 DIAGNOSIS — R194 Change in bowel habit: Secondary | ICD-10-CM | POA: Diagnosis not present

## 2023-10-18 DIAGNOSIS — K6289 Other specified diseases of anus and rectum: Secondary | ICD-10-CM

## 2023-10-18 DIAGNOSIS — Z8719 Personal history of other diseases of the digestive system: Secondary | ICD-10-CM

## 2023-10-18 MED ORDER — IBGARD 90 MG PO CPCR: ORAL_CAPSULE | ORAL | Status: DC

## 2023-10-18 MED ORDER — SUFLAVE 178.7 G PO SOLR: 1.0000 | Freq: Once | ORAL | 0 refills | Status: AC

## 2023-10-18 MED ORDER — IBGARD 90 MG PO CPCR: ORAL_CAPSULE | ORAL | 0 refills | Status: AC

## 2023-10-18 NOTE — Patient Instructions (Addendum)
 Your provider has requested that you have an abdominal x ray before leaving today. Please go to the basement floor to our Radiology department for the test.   Your provider has requested that you go to the basement level for lab work before leaving today. Press "B" on the elevator. The lab is located at the first door on the left as you exit the elevator.   Due to recent changes in healthcare laws, you may see the results of your imaging and laboratory studies on MyChart before your provider has had a chance to review them.  We understand that in some cases there may be results that are confusing or concerning to you. Not all laboratory results come back in the same time frame and the provider may be waiting for multiple results in order to interpret others.  Please give [place] 48 hours in order for your provider to thoroughly review all the results before contacting the office for clarification of your results.   We have given you samples of the following medication to take: IBgard take as directed.   You have been scheduled for an endoscopy and colonoscopy. Please follow the written instructions given to you at your visit today.  If you use inhalers (even only as needed), please bring them with you on the day of your procedure.  DO NOT TAKE 7 DAYS PRIOR TO TEST- Trulicity (dulaglutide) Ozempic, Wegovy (semaglutide) Mounjaro (tirzepatide) Bydureon Bcise (exanatide extended release)  DO NOT TAKE 1 DAY PRIOR TO YOUR TEST Rybelsus (semaglutide) Adlyxin (lixisenatide) Victoza (liraglutide) Byetta (exanatide)  Thank you for trusting me with your gastrointestinal care!   Boone Master, PA  ___________________________________________________________________________  Bonita Quin will receive your bowel preparation through [facility], which ensures the lowest copay and home delivery, with outreach via text or call from an [phone] number. Please respond promptly to avoid rescheduling of your procedure.  If you are interested in alternative options or have any questions regarding your prep, please contact them at [phone] ____________________________________________________________________________  Your Provider Has Sent Your Bowel Prep Regimen To [facility]   [facility] will contact you to verify your information and collect your copay, if applicable. Enjoy the comfort of your home while your prescription is mailed to you, FREE of any shipping charges.   [facility] accepts all major insurance benefits and applies discounts & coupons.  Have additional questions?   Chat: [email or web address] Call: [phone] Email: [email or web address] NCPDP: [id number]  How will [facility] contact you?  With a Welcome phone call,  a Welcome text and a checkout link in text form.  Texts you receive from [phone] Are NOT Spam.  *To set up delivery, you must complete the checkout process via link or speak to one of the patient care representatives. If [facility] is unable to reach you, your prescription may be delayed.  To avoid long hold times on the phone, you may also utilize the secure chat feature on the [facility] website to request that they call you back for transaction completion or to expedite your concerns.

## 2023-10-18 NOTE — Progress Notes (Addendum)
 Chief Complaint: multiple GI symptoms Primary GI MD: Gentry Fitz  HPI: 33 year old female with medical history as listed below including gallstone pancreatitis presents for multiple GI symptoms.  She presents with rectal bleeding, abdominal pain, and constipation. She was referred by her primary care physician for further evaluation of hemorrhoids.  She has been experiencing rectal bleeding, anorectal itching, rectal discomfort, constipation, and lower abdominal pain associated with bowel movements for several months. The bleeding is intermittent, appearing on tissue paper and in the toilet. She has used a cream and foam for hemorrhoids, with the foam providing some relief.  She experiences bowel movements every time she eats, describing them as soft and sometimes resembling diarrhea. She denies needing to push during bowel movements. The frequency of bowel movements is multiple times a day, correlating with meal intake. Unsure if they are greasy/oily.  She reports left-sided abdominal pain and stomach pain, which are not constant but worsen with eating and bowel movements. She has a history of pancreatitis in 2013, attributed to gallbladder issues s/p cholecystectomy. She has no unintended weight loss and is unsure about the presence of oily or greasy stools.  She experiences frequent heartburn and reflux symptoms. She also gets epigastric pain with eating. She does not regularly take ibuprofen or similar medications. She has tried fiber supplements, but they exacerbate her pain during bowel movements.      Past Medical History:  Diagnosis Date   Eczema    Hemorrhoids    Pancreatitis    Plantar fasciitis     Past Surgical History:  Procedure Laterality Date   CHOLECYSTECTOMY      Current Outpatient Medications  Medication Sig Dispense Refill   SUFLAVE 178.7 g SOLR Take 1 kit by mouth once for 1 dose. 1 each 0   ferrous sulfate 325 (65 FE) MG tablet Take 325 mg by mouth daily with  breakfast. (Patient not taking: Reported on 10/18/2023)     folic acid (FOLVITE) 1 MG tablet Take 1 mg by mouth daily. (Patient not taking: Reported on 10/18/2023)     ibuprofen (ADVIL,MOTRIN) 200 MG tablet Take 400 mg by mouth every 6 (six) hours as needed. (Patient not taking: Reported on 10/18/2023)     meloxicam (MOBIC) 7.5 MG tablet Take 7.5 mg by mouth daily. (Patient not taking: Reported on 10/18/2023)     misoprostol (CYTOTEC) 200 MCG tablet Take 200 mcg by mouth 4 (four) times daily. (Patient not taking: Reported on 10/18/2023)     Peppermint Oil (IBGARD) 90 MG CPCR Take as directed as needed. 8 capsule 0   polyethylene glycol powder (GLYCOLAX/MIRALAX) 17 GM/SCOOP powder Take 0.5 Containers by mouth daily. (Patient not taking: Reported on 10/18/2023)     PROCTO-MED HC 2.5 % rectal cream Place 1 Application rectally 2 (two) times daily. (Patient not taking: Reported on 10/18/2023)     PROCTOFOAM HC rectal foam Place 1 applicator rectally 3 (three) times daily. (Patient not taking: Reported on 10/18/2023)     triamcinolone (KENALOG) 0.025 % cream Apply 1 Application topically 2 (two) times daily. (Patient not taking: Reported on 10/18/2023)     No current facility-administered medications for this visit.    Allergies as of 10/18/2023   (No Known Allergies)    Family History  Problem Relation Age of Onset   Diabetes Mother    Hypertension Mother    Vaginal cancer Sister     Social History   Socioeconomic History   Marital status: Married    Spouse name: Not  on file   Number of children: Not on file   Years of education: Not on file   Highest education level: Not on file  Occupational History   Not on file  Tobacco Use   Smoking status: Never   Smokeless tobacco: Never  Substance and Sexual Activity   Alcohol use: No   Drug use: No   Sexual activity: Yes    Comment: "he takes care of it"  Other Topics Concern   Not on file  Social History Narrative   Not on file   Social  Drivers of Health   Financial Resource Strain: Not on File (02/12/2022)   Received from [facility], [place] Strain    Financial Resource Strain: 0  Food Insecurity: Low Risk  (06/09/2023)   Received from [facility]   Hunger Vital Sign    Worried About Running Out of Food in the Last Year: Never true    Ran Out of Food in the Last Year: Never true  Transportation Needs: No Transportation Needs (06/09/2023)   Received from [facility]    In the past 12 months, has lack of reliable transportation kept you from medical appointments, meetings, work or from getting things needed for daily living? : No  Physical Activity: Not on File (02/12/2022)   Received from [place], [place]   Physical Activity    Physical Activity: 0  Stress: Not on File (02/12/2022)   Received from [facility], [place]   Stress    Stress: 0  Social Connections: Not on File (05/07/2023)   Received from [facility]   Social Connections    Connectedness: 0  Intimate Partner Violence: Not on file    Review of Systems:    Constitutional: No weight loss, fever, chills, weakness or fatigue HEENT: Eyes: No change in vision               Ears, Nose, Throat:  No change in hearing or congestion Skin: No rash or itching Cardiovascular: No chest pain, chest pressure or palpitations   Respiratory: No SOB or cough Gastrointestinal: See HPI and otherwise negative Genitourinary: No dysuria or change in urinary frequency Neurological: No headache, dizziness or syncope Musculoskeletal: No new muscle or joint pain Hematologic: No bleeding or bruising Psychiatric: No history of depression or anxiety    Physical Exam:  Vital signs: BP 110/78   Pulse 74   Ht 5\' 4"  (1.626 m)   Wt 212 lb 6 oz (96.3 kg)   LMP 10/06/2023   BMI 36.45 kg/m   Constitutional: NAD, Well developed, Well nourished, alert and cooperative Head:  Normocephalic and atraumatic. Eyes:   PEERL, EOMI. No icterus. Conjunctiva  pink. Respiratory: Respirations even and unlabored. Lungs clear to auscultation bilaterally.   No wheezes, crackles, or rhonchi.  Cardiovascular:  Regular rate and rhythm. No peripheral edema, cyanosis or pallor.  Gastrointestinal:  Soft, nondistended, nontender. No rebound or guarding. Hypoactive bowel sounds. No appreciable masses or hepatomegaly. Rectal:  Not performed.  Msk:  Symmetrical without gross deformities. Without edema, no deformity or joint abnormality.  Neurologic:  Alert and  oriented x4;  grossly normal neurologically.  Skin:   Dry and intact without significant lesions or rashes. Psychiatric: Oriented to person, place and time. Demonstrates good judgement and reason without abnormal affect or behaviors.   RELEVANT LABS AND IMAGING: CBC    Component Value Date/Time   WBC 4.6 07/17/2023 2015   RBC 4.47 07/17/2023 2015   HGB 13.4 07/17/2023  2015   HCT 41.2 07/17/2023 2015   PLT 264 07/17/2023 2015   MCV 92.2 07/17/2023 2015   MCH 30.0 07/17/2023 2015   MCHC 32.5 07/17/2023 2015   RDW 13.3 07/17/2023 2015   LYMPHSABS 1.7 06/10/2021 1118   MONOABS 0.5 06/10/2021 1118   EOSABS 0.2 06/10/2021 1118   BASOSABS 0.0 06/10/2021 1118    CMP     Component Value Date/Time   NA 138 07/17/2023 2015   K 3.6 07/17/2023 2015   CL 104 07/17/2023 2015   CO2 27 07/17/2023 2015   GLUCOSE 129 (H) 07/17/2023 2015   BUN 14 07/17/2023 2015   CREATININE 0.72 07/17/2023 2015   CALCIUM 9.0 07/17/2023 2015   GFRNONAA >60 07/17/2023 2015     Assessment/Plan:      Rectal bleeding and discomfort Patient declined rectal exam, can defer to colonoscopy. Intermittent rectal bleeding and discomfort for several months improving with proctofoam, likely due to hemorrhoids but will need to rule out other causes.. No anemia on CBC. -Colonoscopy for further evaluation - I thoroughly discussed the procedure with the patient (at bedside) to include nature of the procedure, alternatives,  benefits, and risks (including but not limited to bleeding, infection, perforation, anesthesia/cardiac pulmonary complications).  Patient verbalized understanding and gave verbal consent to proceed with procedure.   LLQ pain and altered bowel habits Left lower abdominal pain and questionable constipation, possibly related to Irritable Bowel Syndrome (IBS). Stools with eating. Overflow diarrhea versus pancreatic insufficiency with her history of pancreatitis. She does have formed stool sometimes. Fiber makes pain worse.  -- KUB to evaluate stool burden -- can evaluate during colonoscopy -- IB gard samples provided -- pancreatic fecal elastase  GERD Reports frequent heartburn and epigastric pain.  No NSAID use.  DDx includes esophagitis, gastritis, PUD.Marland Kitchen - EGD with biopsies for H. pylori for further evaluation - I thoroughly discussed the procedure with the patient (at bedside) to include nature of the procedure, alternatives, benefits, and risks (including but not limited to bleeding, infection, perforation, anesthesia/cardiac pulmonary complications).  Patient verbalized understanding and gave verbal consent to proceed with procedure.  -- Pending EGD findings will likely start PPI.  Educated patient on lifestyle modifications.  History of pancreatitis History of pancreatitis in 2013 due to gallstones s/p cholecystectomy-No current plan, continue monitoring.  Follow-up Schedule follow-up after colonoscopy and endoscopy results are available.     Lara Mulch [facility] 10/18/2023, 12:27 PM  Cc: Verlon Au, MD  I have reviewed the clinic note as outlined by Boone Master, PA and agree with the assessment, plan and medical decision making. Ms. Domenic Polite presents to the office with multiple complaints including rectal bleeding, perianal discomfort, quadrant abdominal pain, loose stool and GERD.  Agree with performing EGD and colonoscopy for further  evaluation.  Also with history of pancreatitis secondary to gallstones -she has undergone cholecystectomy-inactive issue that will be monitored.  Maren Beach, MD

## 2023-11-30 ENCOUNTER — Telehealth: Payer: Self-pay | Admitting: Gastroenterology

## 2023-11-30 ENCOUNTER — Telehealth: Payer: Self-pay | Admitting: *Deleted

## 2023-11-30 MED ORDER — SUFLAVE 178.7 G PO SOLR: 1.0000 | ORAL | 0 refills | Status: AC

## 2023-11-30 MED ORDER — NA SULFATE-K SULFATE-MG SULF 17.5-3.13-1.6 GM/177ML PO SOLN: 1.0000 | Freq: Once | ORAL | 0 refills | Status: AC

## 2023-11-30 NOTE — Progress Notes (Deleted)
 [facility] History and Physical   Primary Care Physician:  Kirt Boys, PA-C   Reason for Procedure:  GERD, heartburn, epigastric abdominal pain, left lower quadrant abdominal pain, constipation, intermittent rectal bleeding  Plan:    Upper endoscopy and colonoscopy   HPI: Abigail Terry is a 33 y.o. female undergoing upper endoscopy and colonoscopy for evaluation of GERD, heartburn, epigastric abdominal pain, left lower quadrant abdominal pain and constipation, intermittent rectal bleeding.  Reports a history of frequent heartburn and reflux symptoms.  Endorses epigastric abdominal pain with eating.  No NSAIDs.  No prior EGD.  Does have a history of pancreatitis in 2013.  Has had a cholecystectomy.  Endorses fecal urgency with alternating constipation and diarrhea.  Has had intermittent rectal bleeding that has been attributed to hemorrhoids.  No prior colonoscopy.  No family history of colorectal cancer or polyps.   Past Medical History:  Diagnosis Date   Eczema    Hemorrhoids    Pancreatitis    Plantar fasciitis     Past Surgical History:  Procedure Laterality Date   CHOLECYSTECTOMY      Prior to Admission medications   Medication Sig Start Date End Date Taking? Authorizing Provider  ferrous sulfate 325 (65 FE) MG tablet Take 325 mg by mouth daily with breakfast. Patient not taking: Reported on 10/18/2023 06/19/21   [provider]  folic acid (FOLVITE) 1 MG tablet Take 1 mg by mouth daily. Patient not taking: Reported on 10/18/2023 06/19/21   [provider]  ibuprofen (ADVIL,MOTRIN) 200 MG tablet Take 400 mg by mouth every 6 (six) hours as needed. Patient not taking: Reported on 10/18/2023    [provider]  meloxicam (MOBIC) 7.5 MG tablet Take 7.5 mg by mouth daily. Patient not taking: Reported on 10/18/2023    [provider]  misoprostol (CYTOTEC) 200 MCG tablet Take 200 mcg by mouth 4 (four) times  daily. Patient not taking: Reported on 10/18/2023 06/19/21   [provider]  PEG 3350-KCl-NaCl-NaSulf-MgSul (SUFLAVE) 178.7 g SOLR Take 1 Application by mouth 1 day or 1 dose for 1 dose. 11/30/23 12/01/23  McGrealDurene Romans, MD  Peppermint Oil (IBGARD) 90 MG CPCR Take as directed as needed. 10/18/23   McMichael, Saddie Benders, PA-C  polyethylene glycol powder (GLYCOLAX/MIRALAX) 17 GM/SCOOP powder Take 0.5 Containers by mouth daily. Patient not taking: Reported on 10/18/2023 05/26/23   [provider]  PROCTO-MED HC 2.5 % rectal cream Place 1 Application rectally 2 (two) times daily. Patient not taking: Reported on 10/18/2023 05/27/23   [provider]  PROCTOFOAM [facility] rectal foam Place 1 applicator rectally 3 (three) times daily. Patient not taking: Reported on 10/18/2023    [provider]  triamcinolone (KENALOG) 0.025 % cream Apply 1 Application topically 2 (two) times daily. Patient not taking: Reported on 10/18/2023    [provider]    Current Outpatient Medications  Medication Sig Dispense Refill   ferrous sulfate 325 (65 FE) MG tablet Take 325 mg by mouth daily with breakfast. (Patient not taking: Reported on 10/18/2023)     folic acid (FOLVITE) 1 MG tablet Take 1 mg by mouth daily. (Patient not taking: Reported on 10/18/2023)     ibuprofen (ADVIL,MOTRIN) 200 MG tablet Take 400 mg by mouth every 6 (six) hours as needed. (Patient not taking: Reported on 10/18/2023)     meloxicam (MOBIC) 7.5 MG tablet Take 7.5 mg by mouth daily. (Patient not taking: Reported on 10/18/2023)     misoprostol (  CYTOTEC) 200 MCG tablet Take 200 mcg by mouth 4 (four) times daily. (Patient not taking: Reported on 10/18/2023)     PEG 3350-KCl-NaCl-NaSulf-MgSul (SUFLAVE) 178.7 g SOLR Take 1 Application by mouth 1 day or 1 dose for 1 dose. 1 each 0   Peppermint Oil (IBGARD) 90 MG CPCR Take as directed as needed. 8 capsule 0   polyethylene glycol powder (GLYCOLAX/MIRALAX) 17 GM/SCOOP powder  Take 0.5 Containers by mouth daily. (Patient not taking: Reported on 10/18/2023)     PROCTO-MED HC 2.5 % rectal cream Place 1 Application rectally 2 (two) times daily. (Patient not taking: Reported on 10/18/2023)     PROCTOFOAM HC rectal foam Place 1 applicator rectally 3 (three) times daily. (Patient not taking: Reported on 10/18/2023)     triamcinolone (KENALOG) 0.025 % cream Apply 1 Application topically 2 (two) times daily. (Patient not taking: Reported on 10/18/2023)     No current facility-administered medications for this visit.    Allergies as of 12/01/2023   (No Known Allergies)    Family History  Problem Relation Age of Onset   Diabetes Mother    Hypertension Mother    Vaginal cancer Sister     Social History   Socioeconomic History   Marital status: Married    Spouse name: Not on file   Number of children: Not on file   Years of education: Not on file   Highest education level: Not on file  Occupational History   Not on file  Tobacco Use   Smoking status: Never   Smokeless tobacco: Never  Substance and Sexual Activity   Alcohol use: No   Drug use: No   Sexual activity: Yes    Comment: "he takes care of it"  Other Topics Concern   Not on file  Social History Narrative   Not on file   Social Drivers of Health   Financial Resource Strain: Not on File (02/12/2022)   Received from [employer] [employer] [place]    Financial Resource Strain: 0  Food Insecurity: Low Risk  (06/09/2023)   Received from [facility]   Hunger Vital Sign    Worried About Running Out of Food in the Last Year: Never true    Ran Out of Food in the Last Year: Never true  Transportation Needs: No Transportation Needs (06/09/2023)   Received from [facility]    In the past 12 months, has lack of reliable transportation kept you from medical appointments, meetings, work or from getting things needed for daily living? : No  Physical Activity: Not on File  (02/12/2022)   Received from [place], [place]   Physical Activity    Physical Activity: 0  Stress: Not on File (02/12/2022)   Received from [facility], [place]   Stress    Stress: 0  Social Connections: Not on File (05/07/2023)   Received from [facility]   Social Connections    Connectedness: 0  Intimate Partner Violence: Not on file    Review of Systems:  All other review of systems negative except as mentioned in the HPI.  Physical Exam: Vital signs There were no vitals taken for this visit.  General:   Alert,  Well-developed, well-nourished, pleasant and cooperative in NAD Airway:  Mallampati  Lungs:  Clear throughout to auscultation.   Heart:  Regular rate and rhythm; no murmurs, clicks, rubs,  or gallops. Abdomen:  Soft, nontender and nondistended. Normal bowel sounds.   Neuro/Psych:  Normal mood and affect.  A and O x 3  Maren Beach, MD [facility]

## 2023-11-30 NOTE — Telephone Encounter (Signed)
 Spoke with the patient and pharmacist. Rx sent [facility] on [address] [employer] for [employer].

## 2023-11-30 NOTE — Telephone Encounter (Signed)
 Inbound call from patient, states she has not received prep medication,  or any information from [facility], procedure Is scheduled for tomorrow at 7:00 AM. If necessary her pharmacy of choice is  [facility] [address] in [place].

## 2023-11-30 NOTE — Telephone Encounter (Signed)
 Called the patient back. Sent Rx for Suflave to her [employer]. PT had her instructions and verbalized understanding per assistance from [employer].

## 2023-12-01 ENCOUNTER — Encounter: Payer: Medicaid Other | Admitting: Pediatrics

## 2023-12-01 ENCOUNTER — Telehealth: Payer: Self-pay | Admitting: Pediatrics

## 2023-12-01 NOTE — Telephone Encounter (Signed)
 Dear Dr. Doy Hutching,  I called this patient at 7:15 to see if she was coming for her procedure.  She stated that she tried calling several times around 4:00 pm  and did not get anyone.  She went to pick up her prep however the pharmacy did not have for her.  I will put in a note to have someone call to reschedule her.

## 2024-02-05 ENCOUNTER — Emergency Department (HOSPITAL_COMMUNITY)
Admission: EM | Admit: 2024-02-05 | Discharge: 2024-02-06 | Disposition: A | Discharge status: Home / Self Care | Attending: Emergency Medicine | Admitting: Emergency Medicine

## 2024-02-05 ENCOUNTER — Encounter (HOSPITAL_COMMUNITY): Payer: Self-pay

## 2024-02-05 ENCOUNTER — Emergency Department (HOSPITAL_COMMUNITY)

## 2024-02-05 DIAGNOSIS — R319 Hematuria, unspecified: Secondary | ICD-10-CM | POA: Diagnosis present

## 2024-02-05 DIAGNOSIS — N3001 Acute cystitis with hematuria: Secondary | ICD-10-CM | POA: Insufficient documentation

## 2024-02-05 LAB — CBC WITH DIFFERENTIAL/PLATELET
Abs Immature Granulocytes: 0.02 10*3/uL (ref 0.00–0.07)
Basophils Absolute: 0 10*3/uL (ref 0.0–0.1)
Basophils Relative: 0 %
Eosinophils Absolute: 0.2 10*3/uL (ref 0.0–0.5)
Eosinophils Relative: 2 %
HCT: 39.7 % (ref 36.0–46.0)
Hemoglobin: 12.6 g/dL (ref 12.0–15.0)
Immature Granulocytes: 0 %
Lymphocytes Relative: 20 %
Lymphs Abs: 1.7 10*3/uL (ref 0.7–4.0)
MCH: 28.9 pg (ref 26.0–34.0)
MCHC: 31.7 g/dL (ref 30.0–36.0)
MCV: 91.1 fL (ref 80.0–100.0)
Monocytes Absolute: 0.5 10*3/uL (ref 0.1–1.0)
Monocytes Relative: 6 %
Neutro Abs: 5.7 10*3/uL (ref 1.7–7.7)
Neutrophils Relative %: 72 %
Platelets: 307 10*3/uL (ref 150–400)
RBC: 4.36 MIL/uL (ref 3.87–5.11)
RDW: 13.3 % (ref 11.5–15.5)
WBC: 8.1 10*3/uL (ref 4.0–10.5)
nRBC: 0 % (ref 0.0–0.2)

## 2024-02-05 LAB — COMPREHENSIVE METABOLIC PANEL WITH GFR
ALT: 34 U/L (ref 0–44)
AST: 24 U/L (ref 15–41)
Albumin: 3.9 g/dL (ref 3.5–5.0)
Alkaline Phosphatase: 69 U/L (ref 38–126)
Anion gap: 8 (ref 5–15)
BUN: 19 mg/dL (ref 6–20)
CO2: 24 mmol/L (ref 22–32)
Calcium: 9.2 mg/dL (ref 8.9–10.3)
Chloride: 104 mmol/L (ref 98–111)
Creatinine, Ser: 0.93 mg/dL (ref 0.44–1.00)
GFR, Estimated: >60 mL/min (ref >60)
Glucose, Bld: 98 mg/dL (ref 70–99)
Potassium: 4 mmol/L (ref 3.5–5.1)
Sodium: 136 mmol/L (ref 135–145)
Total Bilirubin: 0.5 mg/dL (ref 0.0–1.2)
Total Protein: 7.5 g/dL (ref 6.5–8.1)

## 2024-02-05 LAB — URINALYSIS, ROUTINE W REFLEX MICROSCOPIC
Bilirubin Urine: NEGATIVE
Glucose, UA: NEGATIVE mg/dL
Ketones, ur: NEGATIVE mg/dL
Nitrite: NEGATIVE
Protein, ur: NEGATIVE mg/dL
Specific Gravity, Urine: 1.014 (ref 1.005–1.030)
pH: 5 (ref 5.0–8.0)

## 2024-02-05 LAB — HCG, SERUM, QUALITATIVE: Preg, Serum: NEGATIVE

## 2024-02-05 MED ORDER — KETOROLAC TROMETHAMINE 15 MG/ML IJ SOLN
15.0000 mg | Freq: Once | INTRAMUSCULAR | Status: AC
  Administered 2024-02-05: 15 mg via INTRAVENOUS
  Filled 2024-02-05: qty 1

## 2024-02-05 MED ORDER — CEFPODOXIME PROXETIL 200 MG PO TABS: 200.0000 mg | ORAL_TABLET | Freq: Two times a day (BID) | ORAL | 0 refills | Status: AC

## 2024-02-05 NOTE — Discharge Instructions (Addendum)
 You were seen in the emerged part today for evaluation of your urinary symptoms.  Your urine shows that you have a urinary tract infection.  For this, I will prescribe you an antibiotic.  You will take this twice a day for the next week.  Please make sure that you are staying well-hydrated drinking plenty of fluid, mainly water.  You can take Tylenol and or ibuprofen as needed for pain.  He can also take over-the-counter Azo to help with your urinary symptoms.  Is important to take the antibiotic as prescribed complete the entirety of the course.  Please call your primary care doctor in the next few days.  I have included information on urinary tract infections into the discharge paperwork for you to review.  If you have any concerns, new or worsening symptoms, please return to your nearest emergency department for evaluation.  -------------------------------------------  [facility] lo atendieron en la sala de emergencias para evaluar sus sntomas urinarios. Su orina muestra que tiene una infeccin del tracto urinario. Para ello, le recetar un antibitico. Lo tomar [employer] al da durante la prxima semana. Por favor, asegrese de [employer] bien hidratado bebiendo abundante lquido, principalmente agua. Puede tomar Tylenol o ibuprofeno segn sea necesario para el dolor. Tambin puede tomar Azo de venta libre para aliviar sus sntomas urinarios. Es importante que tome el antibitico segn lo prescrito y complete el tratamiento completo. Por favor, llame a su mdico de cabecera en los prximos das. He incluido informacin sobre infecciones del tracto urinario en la documentacin del alta para que la revise. Si tiene [place] inquietud, sntomas nuevos o que [place], por favor, regrese al servicio de urgencias ms cercano para una evaluacin. Comunquese con un mdico si: Sus sntomas no han mejorado despus de 1 o 2 das de tratamiento con antibiticos. Los sntomas desaparecen y luego reaparecen. Tiene fiebre o  escalofros. Vomita o tiene ganas de vomitar. Solicite ayuda de inmediato si: Tiene dolor muy intenso en la espalda o la parte baja del vientre. Se desmaya.

## 2024-02-05 NOTE — ED Provider Notes (Signed)
 [facility] EMERGENCY DEPARTMENT AT [facility] Provider Note   CSN: [id number] Arrival date & time: 02/05/24  1955     Patient presents with: Hematuria   Abigail Terry is a 33 y.o. female.  {Add pertinent medical, surgical, social history, OB history to HPI:32947}  Hematuria       Prior to Admission medications   Medication Sig Start Date End Date Taking? Authorizing Provider  ferrous sulfate 325 (65 FE) MG tablet Take 325 mg by mouth daily with breakfast. Patient not taking: Reported on 10/18/2023 06/19/21   [provider]  folic acid (FOLVITE) 1 MG tablet Take 1 mg by mouth daily. Patient not taking: Reported on 10/18/2023 06/19/21   [provider]  ibuprofen (ADVIL,MOTRIN) 200 MG tablet Take 400 mg by mouth every 6 (six) hours as needed. Patient not taking: Reported on 10/18/2023    [provider]  meloxicam (MOBIC) 7.5 MG tablet Take 7.5 mg by mouth daily. Patient not taking: Reported on 10/18/2023    [provider]  misoprostol (CYTOTEC) 200 MCG tablet Take 200 mcg by mouth 4 (four) times daily. Patient not taking: Reported on 10/18/2023 06/19/21   [provider]  Peppermint Oil (IBGARD) 90 MG CPCR Take as directed as needed. 10/18/23   McMichael, Elba [place], PA-C  polyethylene glycol powder (GLYCOLAX/MIRALAX) 17 GM/SCOOP powder Take 0.5 Containers by mouth daily. Patient not taking: Reported on 10/18/2023 05/26/23   [provider]  PROCTO-MED HC 2.5 % rectal cream Place 1 Application rectally 2 (two) times daily. Patient not taking: Reported on 10/18/2023 05/27/23   [provider]  PROCTOFOAM [facility] rectal foam Place 1 applicator rectally 3 (three) times daily. Patient not taking: Reported on 10/18/2023    [provider]  triamcinolone (KENALOG) 0.025 % cream Apply 1 Application topically 2 (two) times daily. Patient not taking: Reported on 10/18/2023    [provider]     Allergies: Patient has no known allergies.    Review of Systems  Genitourinary:  Positive for hematuria.    Updated Vital Signs BP 134/80   Pulse 80   Temp 99 F (37.2 C) (Oral)   Resp 18   SpO2 99%   Physical Exam  (all labs ordered are listed, but only abnormal results are displayed) Labs Reviewed  URINALYSIS, ROUTINE W REFLEX MICROSCOPIC - Abnormal; Notable for the following components:      Result Value   Color, Urine STRAW (*)    Hgb urine dipstick MODERATE (*)    Leukocytes,Ua SMALL (*)    Bacteria, UA RARE (*)    All other components within normal limits  HCG, SERUM, QUALITATIVE  CBC WITH DIFFERENTIAL/PLATELET  COMPREHENSIVE METABOLIC PANEL WITH GFR    EKG: None  Radiology: No results found.  {Document cardiac monitor, telemetry assessment procedure when appropriate:32947} Procedures   Medications Ordered in the ED - No data to display    {Click here for ABCD2, HEART and other calculators REFRESH Note before signing:1}                              Medical Decision Making Amount and/or Complexity of Data Reviewed Labs: ordered.   ***  {Document critical care time when appropriate  Document review of labs and clinical decision tools ie CHADS2VASC2, etc  Document your independent review of radiology images and any outside records  Document your discussion with family members, caretakers and with consultants  Document social determinants of health affecting pt's care  Document your decision making why or why not admission, treatments were needed:32947:::1}   Final diagnoses:  None    ED Discharge Orders     None

## 2024-02-05 NOTE — ED Triage Notes (Signed)
 Pt states that she has been having lower back pain that has been going on a week and hematuria that started today. Denies nausea

## 2024-02-07 NOTE — ED Provider Notes (Signed)
 Pharmacy called and pt's insurance does not cover the Vantin. Rx changed to Keflex 500 mg TID for 7 days.   Sueellen Emery, MD 02/07/24 [phone]

## 2024-02-08 LAB — URINE CULTURE: Culture: >=100000 — AB

## 2024-02-09 ENCOUNTER — Telehealth (HOSPITAL_BASED_OUTPATIENT_CLINIC_OR_DEPARTMENT_OTHER): Payer: Self-pay | Admitting: *Deleted

## 2024-02-09 NOTE — Telephone Encounter (Signed)
 Post ED Visit - Positive Culture Follow-up: Successful Patient Follow-Up  Culture assessed and recommendations reviewed by:  [x]  Denson Flake, Pharm.D. []  Skeet Duke, Pharm.D., BCPS AQ-ID []  Leslee Rase, Pharm.D., BCPS []  Garland Junk, Pharm.D., BCPS []  [place], [address].D., BCPS, AAHIVP []  Alcide Aly, Pharm.D., BCPS, AAHIVP []  Jerri Morale, PharmD, BCPS []  Graham Laws, PharmD, BCPS []  Cleda Curly, PharmD, BCPS []  Tamar Fairly, PharmD  Positive urine culture  []  Patient discharged without antimicrobial prescription and treatment is now indicated []  Organism is resistant to prescribed ED discharge antimicrobial []  Patient with positive blood cultures  Changes discussed with ED provider: Dr. Zackowski Used pacific interpreters # [phone]- New prescription for nitrofurantoin 100mg  BID x 5 days called in to [facility] on [address] in [place] [phone]. Patient instruction to stop taking Keflex.  Contacted patient, date 02/09/24, time 1354   Jessee Mormon 02/09/2024, 1:53 PM

## 2024-02-09 NOTE — Progress Notes (Signed)
 ED Antimicrobial Stewardship Positive Culture Follow Up   Abigail Terry is an 33 y.o. female who presented to [facility] on 02/05/2024 with a chief complaint of  Chief Complaint  Patient presents with   Hematuria    Recent Results (from the past 720 hours)  Urine Culture     Status: Abnormal   Collection Time: 02/05/24 11:28 PM   Specimen: Urine, Clean Catch  Result Value Ref Range Status   Specimen Description   Final    URINE, CLEAN CATCH Performed at [facility], [address] [place], [place] [address]    Special Requests   Final    NONE Performed at [facility], [address], [place], [place] [address]    Culture (A)  Final    >=100,000 COLONIES/mL ESCHERICHIA COLI Confirmed Extended Spectrum Beta-Lactamase Producer (ESBL).  In bloodstream infections from ESBL organisms, carbapenems are preferred over piperacillin/tazobactam. They are shown to have a lower risk of mortality.    Report Status 02/08/2024 FINAL  Final   Organism ID, Bacteria ESCHERICHIA COLI (A)  Final      Susceptibility   Escherichia coli - MIC*    AMPICILLIN >=32 RESISTANT Resistant     CEFAZOLIN >=64 RESISTANT Resistant     CEFEPIME 16 RESISTANT Resistant     CEFTRIAXONE >=64 RESISTANT Resistant     CIPROFLOXACIN <=0.25 SENSITIVE Sensitive     GENTAMICIN <=1 SENSITIVE Sensitive     IMIPENEM <=0.25 SENSITIVE Sensitive     NITROFURANTOIN <=16 SENSITIVE Sensitive     TRIMETH/SULFA >=320 RESISTANT Resistant     AMPICILLIN/SULBACTAM 8 SENSITIVE Sensitive     PIP/TAZO <=4 SENSITIVE Sensitive ug/mL    * >=100,000 COLONIES/mL ESCHERICHIA COLI   33 YOF presents with lower back pain, new onset hematuria, dysuria, and suprapubic pressure. Denies fever, N/V, or flank pain. CT negative for pyelonephritis. Grew E.coli resistant to cefpodoxime. Stop Keflex. Start nitrofurantoin 100mg  BID x 5 days.  ED Provider: Scott Zackowski, MD   [name] 02/09/2024,  11:06 AM PharmD Candidate 2026  Monday - Friday phone -  [phone] Saturday - Sunday phone - [phone]

## 2024-02-26 ENCOUNTER — Other Ambulatory Visit: Payer: Self-pay

## 2024-02-26 DIAGNOSIS — S39012A Strain of muscle, fascia and tendon of lower back, initial encounter: Secondary | ICD-10-CM | POA: Diagnosis not present

## 2024-02-26 DIAGNOSIS — S161XXA Strain of muscle, fascia and tendon at neck level, initial encounter: Secondary | ICD-10-CM | POA: Insufficient documentation

## 2024-02-26 DIAGNOSIS — R519 Headache, unspecified: Secondary | ICD-10-CM | POA: Diagnosis not present

## 2024-02-26 DIAGNOSIS — Y92410 Unspecified street and highway as the place of occurrence of the external cause: Secondary | ICD-10-CM | POA: Insufficient documentation

## 2024-02-26 DIAGNOSIS — S199XXA Unspecified injury of neck, initial encounter: Secondary | ICD-10-CM | POA: Diagnosis present
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
## 2024-02-27 ENCOUNTER — Emergency Department: Admission: EM | Admit: 2024-02-27 | Discharge: 2024-02-27 | Disposition: A | Discharge status: Home / Self Care

## 2024-02-27 ENCOUNTER — Emergency Department

## 2024-02-27 DIAGNOSIS — S161XXA Strain of muscle, fascia and tendon at neck level, initial encounter: Secondary | ICD-10-CM

## 2024-02-27 DIAGNOSIS — S39012A Strain of muscle, fascia and tendon of lower back, initial encounter: Secondary | ICD-10-CM

## 2024-02-27 LAB — POC URINE PREG, ED: Preg Test, Ur: NEGATIVE

## 2024-02-27 MED ORDER — LIDOCAINE 5 % EX PTCH: 1.0000 | MEDICATED_PATCH | CUTANEOUS | 0 refills | Status: AC

## 2024-02-27 MED ORDER — IBUPROFEN 400 MG PO TABS
600.0000 mg | ORAL_TABLET | Freq: Once | ORAL | Status: AC
  Administered 2024-02-27: 600 mg via ORAL
  Filled 2024-02-27: qty 2

## 2024-02-27 MED ORDER — ACETAMINOPHEN 500 MG PO TABS
1000.0000 mg | ORAL_TABLET | Freq: Once | ORAL | Status: AC
  Administered 2024-02-27: 1000 mg via ORAL
  Filled 2024-02-27: qty 2

## 2024-02-27 MED ORDER — IBUPROFEN 200 MG PO TABS: 600.0000 mg | ORAL_TABLET | Freq: Three times a day (TID) | ORAL | 2 refills | Status: AC | PRN

## 2024-02-27 MED ORDER — ACETAMINOPHEN 500 MG PO TABS: 1000.0000 mg | ORAL_TABLET | Freq: Four times a day (QID) | ORAL | 2 refills | Status: AC | PRN
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
## 2024-02-27 NOTE — Discharge Instructions (Signed)
 Your evaluation in the emergency department was reassuring. I have prescribed you medication for pain. Please follow up with your primary care provider for re-evaluation. Return to the emergency department with any new or worsening symptoms.   //  Su evaluacin en urgencias fue tranquilizadora. Le he recetado analgsicos. Por favor, consulte con su mdico de cabecera para una reevaluacin. Si presenta sntomas nuevos o que empeoran, regrese a Oceanographer.
